# Patient Record
Sex: Female | Born: 1955 | Race: White | Hispanic: No | Marital: Married | Smoking: Former smoker
Health system: Southern US, Community
[De-identification: ages and names within clinical notes are randomized; demographics above are authoritative.]

## PROBLEM LIST (undated history)

## (undated) DIAGNOSIS — R002 Palpitations: Secondary | ICD-10-CM

## (undated) HISTORY — PX: BUNIONECTOMY: SHX129

## (undated) HISTORY — DX: Palpitations: R00.2

## (undated) HISTORY — PX: OTHER SURGICAL HISTORY: SHX169

---

## 2000-09-12 ENCOUNTER — Encounter: Payer: Self-pay | Admitting: Family Medicine

## 2000-09-12 ENCOUNTER — Encounter: Admission: RE | Admit: 2000-09-12 | Discharge: 2000-09-12 | Payer: Self-pay | Admitting: Family Medicine

## 2003-01-01 ENCOUNTER — Encounter: Admission: RE | Admit: 2003-01-01 | Discharge: 2003-01-01 | Payer: Self-pay | Admitting: Family Medicine

## 2004-12-07 ENCOUNTER — Encounter: Admission: RE | Admit: 2004-12-07 | Discharge: 2004-12-07 | Payer: Self-pay | Admitting: Family Medicine

## 2006-05-23 ENCOUNTER — Encounter: Admission: RE | Admit: 2006-05-23 | Discharge: 2006-05-23 | Payer: Self-pay | Admitting: Family Medicine

## 2007-08-20 ENCOUNTER — Encounter: Admission: RE | Admit: 2007-08-20 | Discharge: 2007-08-20 | Payer: Self-pay | Admitting: Family Medicine

## 2010-12-15 ENCOUNTER — Ambulatory Visit (INDEPENDENT_AMBULATORY_CARE_PROVIDER_SITE_OTHER): Payer: BC Managed Care – PPO

## 2010-12-15 DIAGNOSIS — Z23 Encounter for immunization: Secondary | ICD-10-CM

## 2011-08-29 ENCOUNTER — Other Ambulatory Visit: Payer: Self-pay | Admitting: Family Medicine

## 2011-08-29 DIAGNOSIS — Z780 Asymptomatic menopausal state: Secondary | ICD-10-CM

## 2011-09-15 ENCOUNTER — Ambulatory Visit
Admission: RE | Admit: 2011-09-15 | Discharge: 2011-09-15 | Disposition: A | Discharge status: Home / Self Care | Payer: BC Managed Care – PPO | Source: Ambulatory Visit | Attending: Family Medicine | Admitting: Family Medicine

## 2011-09-15 DIAGNOSIS — Z780 Asymptomatic menopausal state: Secondary | ICD-10-CM

## 2011-10-24 ENCOUNTER — Ambulatory Visit (INDEPENDENT_AMBULATORY_CARE_PROVIDER_SITE_OTHER): Payer: BC Managed Care – PPO | Admitting: Family Medicine

## 2011-10-24 VITALS — BP 102/62 | HR 100 | Temp 102.9°F | Resp 18 | Ht 63.5 in | Wt 126.0 lb

## 2011-10-24 DIAGNOSIS — R6889 Other general symptoms and signs: Secondary | ICD-10-CM

## 2011-10-24 DIAGNOSIS — R509 Fever, unspecified: Secondary | ICD-10-CM

## 2011-10-24 DIAGNOSIS — J111 Influenza due to unidentified influenza virus with other respiratory manifestations: Secondary | ICD-10-CM

## 2011-10-24 LAB — POCT INFLUENZA A/B
Influenza A, POC: NEGATIVE
Influenza B, POC: NEGATIVE

## 2011-10-24 LAB — POCT CBC
Granulocyte percent: 80.3 %G — AB (ref 37–80)
HCT, POC: 42.2 % (ref 37.7–47.9)
Hemoglobin: 12.6 g/dL (ref 12.2–16.2)
Lymph, poc: 0.7 (ref 0.6–3.4)
MCH, POC: 29 pg (ref 27–31.2)
MCHC: 29.9 g/dL — AB (ref 31.8–35.4)
MCV: 97.1 fL — AB (ref 80–97)
MID (cbc): 0.3 (ref 0–0.9)
MPV: 6.6 fL (ref 0–99.8)
POC Granulocyte: 4 (ref 2–6.9)
POC LYMPH PERCENT: 14 %L (ref 10–50)
POC MID %: 5.7 %M (ref 0–12)
Platelet Count, POC: 231 10*3/uL (ref 142–424)
RBC: 4.35 M/uL (ref 4.04–5.48)
RDW, POC: 12.4 %
WBC: 5 10*3/uL (ref 4.6–10.2)

## 2011-10-24 MED ORDER — OSELTAMIVIR PHOSPHATE 75 MG PO CAPS: 75.0000 mg | ORAL_CAPSULE | Freq: Two times a day (BID) | ORAL | Status: DC

## 2011-10-24 NOTE — Progress Notes (Signed)
[facility]:  Office Visit  Chief Complaint:  Chief Complaint  Patient presents with  . Fever    ?  flu 103 today  . Generalized Body Aches    headache/ stated with sore throat this is better some cough today    HPI: Amy Pitts is a 56 y.o. female who complains of  Acute onset x 1 day of body aches and headaches and fever T max 103. Trying to let fever defervece at night , has been taking Advil during the day. 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  History reviewed. No pertinent past medical history. Past Surgical History  Procedure Date  . C cection   . Bunionectomy    History   Social History  . Marital Status: Married    Spouse Name: N/A    Number of Children: N/A  . Years of Education: N/A   Social History Main Topics  . Smoking status: Former Smoker -- 2 years  . Smokeless tobacco: Never Used  . Alcohol Use: 1.0 oz/week    2 drink(s) per week  . Drug Use: No  . Sexually Active: None   Other Topics Concern  . None   Social History Narrative  . None   Family History  Problem Relation Age of Onset  . Hypertension Mother   . Heart disease Father    Allergies no known allergies Prior to Admission medications   Not on File     ROS: The patient denies night sweats, unintentional weight loss, chest pain, palpitations, wheezing, dyspnea on exertion, nausea, vomiting, abdominal pain, dysuria, hematuria, melena, numbness, weakness, or tingling.  All other systems have been reviewed and were otherwise negative with the exception of those mentioned in the HPI and as above.    PHYSICAL EXAM: Filed Vitals:   10/24/11 1601  BP: 102/62  Pulse: 100  Temp: 102.9 F (39.4 C)  Resp: 18   Filed Vitals:   10/24/11 1601  Height: 5' 3.5" (1.613 m)  Weight: 126 lb (57.153 kg)   Body mass index is 21.97 kg/(m^2).  General: Alert, no acute distress HEENT:  Normocephalic, atraumatic,  oropharynx patent.  Cardiovascular:  Regular rate and rhythm, no rubs murmurs or gallops.  No Carotid bruits, radial pulse intact. No pedal edema.  Respiratory: Clear to auscultation bilaterally.  No wheezes, rales, or rhonchi.  No cyanosis, no use of accessory musculature GI: No organomegaly, abdomen is soft and non-tender, positive bowel sounds.  No masses. Skin: No rashes. Neurologic: Facial musculature symmetric. Psychiatric: Patient is appropriate throughout our interaction. Lymphatic: No cervical lymphadenopathy Musculoskeletal: Gait intact.   LABS: Results for orders placed in visit on 10/24/11  POCT INFLUENZA A/B      Component Value Range   Influenza A, POC Negative     Influenza B, POC Negative    POCT CBC      Component Value Range   WBC 5.0  4.6 - 10.2 K/uL   Lymph, poc 0.7  0.6 - 3.4   POC LYMPH PERCENT 14.0  10 - 50 %L   MID (cbc) 0.3  0 - 0.9   POC MID % 5.7  0 - 12 %M   POC Granulocyte 4.0  2 - 6.9   Granulocyte percent 80.3 (*) 37 - 80 %G   RBC 4.35  4.04 - 5.48 M/uL   Hemoglobin 12.6  12.2 - 16.2 g/dL  HCT, POC 42.2  37.7 - 47.9 %   MCV 97.1 (*) 80 - 97 fL   MCH, POC 29.0  27 - 31.2 pg   MCHC 29.9 (*) 31.8 - 35.4 g/dL   RDW, POC 40.9     Platelet Count, POC 231  142 - 424 K/uL   MPV 6.6  0 - 99.8 fL     EKG/XRAY:   Primary read interpreted by Dr. Conley Rolls at [facility].   ASSESSMENT/PLAN: Encounter Diagnoses  Name Primary?  . Flu-like symptoms Yes  . 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

## 2011-10-25 ENCOUNTER — Telehealth: Payer: Self-pay | Admitting: Family Medicine

## 2011-10-25 NOTE — Telephone Encounter (Signed)
LM that CBC was normal

## 2013-08-27 ENCOUNTER — Other Ambulatory Visit: Payer: Self-pay | Admitting: Chiropractic Medicine

## 2013-08-27 ENCOUNTER — Ambulatory Visit
Admission: RE | Admit: 2013-08-27 | Discharge: 2013-08-27 | Disposition: A | Discharge status: Home / Self Care | Payer: BC Managed Care – PPO | Source: Ambulatory Visit | Attending: Chiropractic Medicine | Admitting: Chiropractic Medicine

## 2013-08-27 DIAGNOSIS — M542 Cervicalgia: Secondary | ICD-10-CM

## 2014-12-23 ENCOUNTER — Other Ambulatory Visit: Payer: Self-pay

## 2014-12-23 DIAGNOSIS — Z1231 Encounter for screening mammogram for malignant neoplasm of breast: Secondary | ICD-10-CM

## 2015-01-20 ENCOUNTER — Ambulatory Visit
Admission: RE | Admit: 2015-01-20 | Discharge: 2015-01-20 | Disposition: A | Discharge status: Home / Self Care | Payer: BC Managed Care – PPO | Source: Ambulatory Visit

## 2015-01-20 DIAGNOSIS — Z1231 Encounter for screening mammogram for malignant neoplasm of breast: Secondary | ICD-10-CM

## 2015-04-07 ENCOUNTER — Other Ambulatory Visit (HOSPITAL_COMMUNITY)
Admission: RE | Admit: 2015-04-07 | Discharge: 2015-04-07 | Disposition: A | Discharge status: Home / Self Care | Payer: BC Managed Care – PPO | Source: Ambulatory Visit | Attending: Family Medicine | Admitting: Family Medicine

## 2015-04-07 ENCOUNTER — Other Ambulatory Visit: Payer: Self-pay | Admitting: Family Medicine

## 2015-04-07 DIAGNOSIS — Z1151 Encounter for screening for human papillomavirus (HPV): Secondary | ICD-10-CM | POA: Diagnosis present

## 2015-04-07 DIAGNOSIS — Z01419 Encounter for gynecological examination (general) (routine) without abnormal findings: Secondary | ICD-10-CM | POA: Insufficient documentation

## 2015-04-08 LAB — CYTOLOGY - PAP

## 2017-11-25 NOTE — Progress Notes (Signed)
Amy ScaleZach Flossie Pitts D.O. [facility] [address] [place], [place] [address] Phone: [phone] Subjective:   I Amy NighKana Pitts am serving as a Neurosurgeonscribe for Dr. Antoine PrimasZachary Kimiah Pitts.   CC: Left greater than right shoulder pain  WNU:[id number]:Subjective  Amy RushSusan G Pitts Amy Pitts is a 62 y.o. female coming in with complaint of bilateral shoulder pain. Left is worse. Initially felt like a frozen shoulder. Has been manipulated. Never had xrays. Wants a clear diagnosis so she knows what type of exercises she can do. Limited ROM. Pain radiates down the arm. No numbness and tingling or neck pain noted. Has noted her shoulder pop.   Onset- Chronic Location- Anterior Duration-  Character- sharp, puling Aggravating factors- extension, flexion at 90 degrees Reliving factors- Ibuprofen, heat Therapies tried- Manipulation, dry needling, pressure point release with ball Severity-7 out of 10     No past medical history on file. Past Surgical History:  Procedure Laterality Date  . BUNIONECTOMY    . c cection     Social History   Socioeconomic History  . Marital status: Married    Spouse name: Not on file  . Number of children: Not on file  . Years of education: Not on file  . Highest education level: Not on file  Occupational History  . Not on file  Social Needs  . Financial resource strain: Not on file  . Food insecurity:    Worry: Not on file    Inability: Not on file  . Transportation needs:    Medical: Not on file    Non-medical: Not on file  Tobacco Use  . Smoking status: Former Smoker    Years: 2.00  . Smokeless tobacco: Never Used  Substance and Sexual Activity  . Alcohol use: Yes    Alcohol/week: 2.0 standard drinks    Types: 2 drink(s) per week  . Drug use: No  . Sexual activity: Not on file  Lifestyle  . Physical activity:    Days per week: Not on file    Minutes per session: Not on file  . Stress: Not on file  Relationships  . Social connections:    Talks on phone: Not on file      Gets together: Not on file    Attends religious service: Not on file    Active member of club or organization: Not on file    Attends meetings of clubs or organizations: Not on file    Relationship status: Not on file  Other Topics Concern  . Not on file  Social History Narrative  . Not on file   Allergies no known allergies Family History  Problem Relation Age of Onset  . Hypertension Mother   . Heart disease Father      Current Outpatient Medications (Cardiovascular):  .  nitroGLYCERIN (NITRODUR - DOSED IN MG/24 HR) 0.2 mg/hr patch, 1/4 patch daily     Current Outpatient Medications (Other):  .  oseltamivir (TAMIFLU) 75 MG capsule, Take 1 capsule (75 mg total) by mouth 2 (two) times daily. .  Vitamin D, Ergocalciferol, (DRISDOL) 1.25 MG (50000 UT) CAPS capsule, Take 1 capsule (50,000 Units total) by mouth every 7 (seven) days.    Past medical history, social, surgical and family history all reviewed in electronic medical record.  No pertanent information unless stated regarding to the chief complaint.   Review of Systems:  No headache, visual changes, nausea, vomiting, diarrhea, constipation, dizziness, abdominal pain, skin rash, fevers, chills, night sweats, weight loss, swollen lymph  nodes, body aches, joint swelling, muscle aches, chest pain, shortness of breath, mood changes.   Objective  Blood pressure 122/82, pulse (!) 59, height 5' 3.5" (1.613 m), weight 128 lb (58.1 kg), SpO2 98 %.    General: No apparent distress alert and oriented x3 mood and affect normal, dressed appropriately.  HEENT: Pupils equal, extraocular movements intact  Respiratory: Patient's speak in full sentences and does not appear short of breath  Cardiovascular: No lower extremity edema, non tender, no erythema  Skin: Warm dry intact with no signs of infection or rash on extremities or on axial skeleton.  Abdomen: Soft nontender  Neuro: Cranial nerves II through XII are intact,  neurovascularly intact in all extremities with 2+ DTRs and 2+ pulses.  Lymph: No lymphadenopathy of posterior or anterior cervical chain or axillae bilaterally.  Gait normal with good balance and coordination.  MSK:  Non tender with full range of motion and good stability and symmetric strength and tone of  elbows, wrist, hip, knee and ankles bilaterally.  Shoulder: left Inspection reveals no abnormalities, atrophy or asymmetry. Palpation is normal with no tenderness over AC joint or bicipital groove. Motion decreased external rotation Rotator cuff strength normal throughout. signs of impingement with positive Neer and Hawkin's tests, but negative empty can sign. Speeds and Yergason's tests normal. No labral pathology noted with negative Obrien's, negative clunk and good stability. Normal scapular function observed. No painful arc and no drop arm sign. No apprehension sign  Contralateral shoulder has some mild impingement but full strength  MSK US performed of: left This study was ordered, performed, and interpreted by Terrilee Files D.O.  Shoulder:   Supraspinatus: Degenerative changes noted.  Rotator cuff tear appreciated but no true retraction mild calcific changes Subscapularis:  Appears normal on long and transverse views. Positive bursa Teres Minor:  Appears normal on long and transverse views. AC joint: Arthritic changes Glenohumeral Joint: Mild arthritis Glenoid Labrum:  Intact without visualized tears. Biceps Tendon:  Appears normal on long and transverse views, no fraying of tendon, tendon located in intertubercular groove, no subluxation with shoulder internal or external rotation.  Impression: Subacromial bursitis with degenerative rotator cuff tear but no retraction      Impression and Recommendations:     This case required medical decision making of moderate complexity. The above documentation has been reviewed and is accurate and complete [name], DO         Note: This dictation was prepared with Dragon dictation along with smaller phrase technology. Any transcriptional errors that result from this process are unintentional.

## 2017-11-26 ENCOUNTER — Ambulatory Visit: Payer: BC Managed Care – PPO | Admitting: Family Medicine

## 2017-11-26 ENCOUNTER — Ambulatory Visit (INDEPENDENT_AMBULATORY_CARE_PROVIDER_SITE_OTHER)
Admission: RE | Admit: 2017-11-26 | Discharge: 2017-11-26 | Disposition: A | Discharge status: Home / Self Care | Payer: BC Managed Care – PPO | Source: Ambulatory Visit | Attending: Family Medicine | Admitting: Family Medicine

## 2017-11-26 ENCOUNTER — Ambulatory Visit: Payer: Self-pay

## 2017-11-26 ENCOUNTER — Encounter: Payer: Self-pay | Admitting: Family Medicine

## 2017-11-26 VITALS — BP 122/82 | HR 59 | Ht 63.5 in | Wt 128.0 lb

## 2017-11-26 DIAGNOSIS — G8929 Other chronic pain: Secondary | ICD-10-CM | POA: Diagnosis not present

## 2017-11-26 DIAGNOSIS — M25512 Pain in left shoulder: Secondary | ICD-10-CM | POA: Diagnosis not present

## 2017-11-26 DIAGNOSIS — M75112 Incomplete rotator cuff tear or rupture of left shoulder, not specified as traumatic: Secondary | ICD-10-CM

## 2017-11-26 DIAGNOSIS — M25511 Pain in right shoulder: Secondary | ICD-10-CM

## 2017-11-26 DIAGNOSIS — M75102 Unspecified rotator cuff tear or rupture of left shoulder, not specified as traumatic: Secondary | ICD-10-CM | POA: Insufficient documentation

## 2017-11-26 MED ORDER — NITROGLYCERIN 0.2 MG/HR TD PT24: MEDICATED_PATCH | TRANSDERMAL | 1 refills | Status: DC

## 2017-11-26 MED ORDER — VITAMIN D (ERGOCALCIFEROL) 1.25 MG (50000 UNIT) PO CAPS: 50000.0000 [IU] | ORAL_CAPSULE | ORAL | 0 refills | Status: DC

## 2017-11-26 NOTE — Patient Instructions (Signed)
Good to see you.  Ice 20 minutes 2 times daily. Usually after activity and before bed. pennsaid pinkie amount topically 2 times daily as needed.  Keep hands within peripheral vision  Once weekly vitamin D for 12 weeks pennsaid pinkie amount topically 2 times daily as needed.  Over the counter get turmeric 500mg  daily  Xray downstairs on way out Nitroglycerin Protocol   Apply 1/4 nitroglycerin patch to affected area daily.  Change position of patch within the affected area every 24 hours.  You may experience a headache during the first 1-2 weeks of using the patch, these should subside.  If you experience headaches after beginning nitroglycerin patch treatment, you may take your preferred over the counter pain reliever.  Another side effect of the nitroglycerin patch is skin irritation or rash related to patch adhesive.  Please notify our office if you develop more severe headaches or rash, and stop the patch.  Tendon healing with nitroglycerin patch may require 12 to 24 weeks depending on the extent of injury.  Men should not use if taking Viagra, Cialis, or Levitra.   Do not use if you have migraines or rosacea.  See me again in 4-6 weeks

## 2017-11-26 NOTE — Assessment & Plan Note (Signed)
Degenerative tear noted.  Discussed icing regimen and home exercises.  Discussed which activities to do which wants to avoid.  Patient will increase activity slowly over the course the next several days.  Discussed which activities to do which wants to avoid.  Follow-up again in 4 to 6 weeks

## 2018-01-14 ENCOUNTER — Ambulatory Visit: Payer: BC Managed Care – PPO | Admitting: Family Medicine

## 2018-02-08 ENCOUNTER — Other Ambulatory Visit: Payer: Self-pay | Admitting: Family Medicine

## 2018-02-08 DIAGNOSIS — Z1231 Encounter for screening mammogram for malignant neoplasm of breast: Secondary | ICD-10-CM

## 2018-02-11 ENCOUNTER — Ambulatory Visit
Admission: RE | Admit: 2018-02-11 | Discharge: 2018-02-11 | Disposition: A | Discharge status: Home / Self Care | Payer: BC Managed Care – PPO | Source: Ambulatory Visit | Attending: Family Medicine | Admitting: Family Medicine

## 2018-02-11 DIAGNOSIS — Z1231 Encounter for screening mammogram for malignant neoplasm of breast: Secondary | ICD-10-CM

## 2018-02-12 ENCOUNTER — Other Ambulatory Visit: Payer: Self-pay | Admitting: Family Medicine

## 2018-09-04 ENCOUNTER — Other Ambulatory Visit: Payer: Self-pay | Admitting: Family Medicine

## 2018-09-04 NOTE — Telephone Encounter (Signed)
Left message for patient to call back for virtual visit.  

## 2018-12-04 ENCOUNTER — Other Ambulatory Visit: Payer: Self-pay

## 2018-12-04 DIAGNOSIS — Z20822 Contact with and (suspected) exposure to covid-19: Secondary | ICD-10-CM

## 2018-12-05 LAB — NOVEL CORONAVIRUS, NAA: SARS-CoV-2, NAA: NOT DETECTED

## 2019-03-27 ENCOUNTER — Other Ambulatory Visit: Payer: Self-pay

## 2019-03-27 ENCOUNTER — Encounter: Payer: Self-pay | Admitting: Cardiology

## 2019-03-27 ENCOUNTER — Ambulatory Visit: Payer: BC Managed Care – PPO | Admitting: Cardiology

## 2019-03-27 ENCOUNTER — Encounter: Payer: Self-pay | Admitting: *Deleted

## 2019-03-27 VITALS — BP 133/79 | HR 60 | Ht 62.0 in | Wt 129.6 lb

## 2019-03-27 DIAGNOSIS — Z8249 Family history of ischemic heart disease and other diseases of the circulatory system: Secondary | ICD-10-CM

## 2019-03-27 DIAGNOSIS — Z7189 Other specified counseling: Secondary | ICD-10-CM | POA: Diagnosis not present

## 2019-03-27 DIAGNOSIS — R002 Palpitations: Secondary | ICD-10-CM

## 2019-03-27 NOTE — Progress Notes (Signed)
Cardiology Office Note:    Date:  03/27/2019   ID:  Amy Pitts, DOB 02-Feb-1955, MRN [id number]  PCP:  Patient, No Pcp Per  Cardiologist:  Jodelle Red, MD  Referring MD: Jarrett Soho, PA-C   CC: new patient consultation for palpitations  History of Present Illness:    Amy Pitts is a 64 y.o. female with a hx of seasonal allergies, depression who is seen as a new consult at the request of Jarrett Soho, PA-C for the evaluation and management of palpitations.  Note from visit dated 03/13/19 with Jarrett Soho, PA reviewed from [place] at [facility]. Noted sensations in her chest intermittently for the last several years. History of remote echo due to possible murmur, told it was normal.  Brings a copy of her echo report from 08/28/1989. Noted to have normal echo, normal valves without disease, normal IAS and IVS, normal cardiac dimensions, normal ventricular function, normal color flow.  Once "in a blue moon" she would noticed her heart for about a second, then wouldn't notice for about a year. May have increased slightly over time. 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 Initially responded very well, but then began noting frequent regular hard heartbeats and an occasional chest flutter that makes her cough. Happening several times/day since 01/2019. Also feels very hard beats on occasion.  -Aggravating/alleviating factors: none other than bupropion. Possibly also less exercise than before -Syncope/near syncope: none -Prior cardiac history: none -Prior workup: echo as noted -Prior treatment: none -Possible medication interactions: bupropion -Caffeine: 2 strong cups of coffee in the AM -Alcohol: 1 glass of wine per night twice per week -Tobacco: former, remote (senior year high school/freshman year of college) -OTC supplements: only as listed -Comorbidities: none -Exercise level: usually exercise 5-6  days/week, either walking 4 miles or riding stationary bike. -Labs: not yet completed, pending lipids, CMP, TSH, CBC -Cardiac ROS: no shortness of breath, no PND, no orthopnea, no LE edema. -Family history: father died of massive MI at age 69, many risk factors. Mother had a stroke, had unregulated high blood pressure.  History reviewed. No pertinent past medical history.  Past Surgical History:  Procedure Laterality Date  . BUNIONECTOMY    . c cection      Current Medications: Current Outpatient Medications on File Prior to Visit  Medication Sig  . loratadine (CLARITIN) 10 MG tablet Take 10 mg by mouth daily.  Marland Kitchen buPROPion (WELLBUTRIN SR) 100 MG 12 hr tablet Take 100 mg by mouth every morning.  . Vitamin D, Ergocalciferol, (DRISDOL) 1.25 MG (50000 UT) CAPS capsule TAKE 1 CAPSULE (50,000 UNITS TOTAL) BY MOUTH EVERY 7 (SEVEN) DAYS.   No current facility-administered medications on file prior to visit.     Allergies:   Patient has no known allergies.   Social History   Tobacco Use  . Smoking status: Former Smoker    Years: 2.00  . Smokeless tobacco: Never Used  Substance Use Topics  . Alcohol use: Yes    Alcohol/week: 2.0 standard drinks    Types: 2 drink(s) per week  . Drug use: No    Family History: family history includes Heart disease in her father; Hypertension in her mother. There is no history of Breast cancer.  ROS:   Please see the history of present illness.  Additional pertinent ROS: Constitutional: Negative for chills, fever, night sweats, unintentional weight loss  HENT: Negative for ear pain and hearing loss.  Eyes: Negative for loss of vision and eye pain.  Respiratory: Negative for cough, sputum, wheezing.   Cardiovascular: See HPI. Gastrointestinal: Negative for abdominal pain, melena, and hematochezia.  Genitourinary: Negative for dysuria and hematuria.  Musculoskeletal: Negative for falls and myalgias.  Skin: Negative for itching and rash.    Neurological: Negative for focal weakness, focal sensory changes and loss of consciousness.  Endo/Heme/Allergies: Does not bruise/bleed easily.     EKGs/Labs/Other Studies Reviewed:    The following studies were reviewed today: No prior cardiac studies  EKG:  EKG is personally reviewed.  The ekg ordered today demonstrates sinus bradycardia with artifact  Recent Labs: No results found for requested labs within last 8760 hours.  Recent Lipid Panel No results found for: CHOL, TRIG, HDL, CHOLHDL, VLDL, LDLCALC, LDLDIRECT  Physical Exam:    VS:  BP 133/79   Pulse 60   Ht 5\' 2"  (1.575 m)   Wt 129 lb 9.6 oz (58.8 kg)   SpO2 100%   BMI 23.70 kg/m     Wt Readings from Last 3 Encounters:  11/26/17 128 lb (58.1 kg)  10/24/11 126 lb (57.2 kg)    GEN: Well nourished, well developed in no acute distress HEENT: Normal, moist mucous membranes NECK: No JVD CARDIAC: regular rhythm, normal S1 and S2, no rubs or gallops. No murmurs. VASCULAR: Radial and DP pulses 2+ bilaterally. No carotid bruits RESPIRATORY:  Clear to auscultation without rales, wheezing or rhonchi  ABDOMEN: Soft, non-tender, non-distended MUSCULOSKELETAL:  Ambulates independently SKIN: Warm and dry, no edema NEUROLOGIC:  Alert and oriented x 3. No focal neuro deficits noted. PSYCHIATRIC:  Normal affect    ASSESSMENT:    1. Palpitation   2. Cardiac risk counseling   3. Counseling on health promotion and disease prevention   4. Family history of heart disease    PLAN:    Palpitations: -ECG today sinus bradycardia -will get Zio monitor to exclude significant arrhythmias  Family history of heart disease, in both mother and father: discussed recommendations for prevention, below -recommend checking lipids for further risk stratification. She has upcoming labs with PCP  Cardiac risk counseling and prevention recommendations: -recommend heart healthy/Mediterranean diet, with whole grains, fruits, vegetable, fish,  lean meats, nuts, and olive oil. Limit salt. -recommend moderate walking, 3-5 times/week for 30-50 minutes each session. Aim for at least 150 minutes.week. Goal should be pace of 3 miles/hours, or walking 1.5 miles in 30 minutes -recommend avoidance of tobacco products. Avoid excess alcohol. -ASCVD risk score: The ASCVD Risk score 10/26/11 DC Jr., et al., 2013) failed to calculate for the following reasons:   Cannot find a previous HDL lab   Cannot find a previous total cholesterol lab    Plan for follow up: if monitor unremarkable, can follow up as needed  2014, MD, PhD [place]  [facility]    Medication Adjustments/Labs and Tests Ordered: Current medicines are reviewed at length with the patient today.  Concerns regarding medicines are outlined above.  Orders Placed This Encounter  Procedures  . LONG TERM MONITOR (3-14 DAYS)  . EKG 12-Lead   No orders of the defined types were placed in this encounter.   Patient Instructions  Medication Instructions:  Your Physician recommend you continue on your current medication as directed.    *If you need a refill on your cardiac medications before your next appointment, please call your pharmacy*   Lab Work: None  Testing/Procedures: Our physician has recommended that you wear an 7  DAY ZIO-PATCH monitor. The Zio patch cardiac monitor continuously records heart rhythm data for up to 14 days, this is for patients being evaluated for multiple types heart rhythms. For the first 24 hours post application, please avoid getting the Zio monitor wet in the shower or by excessive sweating during exercise. After that, feel free to carry on with regular activities. Keep soaps and lotions away from the ZIO XT Patch.   Someone will call to mail monitor.       Follow-Up: At [facility], you and your health needs are our priority.  As part of our continuing mission to provide you with exceptional heart care, we have created  designated Provider Care Teams.  These Care Teams include your primary Cardiologist (physician) and Advanced Practice Providers (APPs -  Physician Assistants and Nurse Practitioners) who all work together to provide you with the care you need, when you need it.  We recommend signing up for the patient portal called "MyChart".  Sign up information is provided on this After Visit Summary.  MyChart is used to connect with patients for Virtual Visits (Telemedicine).  Patients are able to view lab/test results, encounter notes, upcoming appointments, etc.  Non-urgent messages can be sent to your provider as well.   To learn more about what you can do with MyChart, go to [email or web address].    Your next appointment:   As needed  The format for your next appointment:   Either In Person or Virtual  Provider:   Buford Dresser, MD  [place] Instructions   Your physician has requested you wear your ZIO patch monitor___7____days.   This is a single patch monitor.  [employer] supplies one patch monitor per enrollment.  Additional stickers are not available.   Please do not apply patch if you will be having a Nuclear Stress Test, Echocardiogram, Cardiac CT, MRI, or Chest Xray during the time frame you would be wearing the monitor. The patch cannot be worn during these tests.  You cannot remove and re-apply the ZIO XT patch monitor.   Your ZIO patch monitor will be sent USPS Priority mail from [facility] directly to your home address. The monitor may also be mailed to a PO BOX if home delivery is not available.   It may take 3-5 days to receive your monitor after you have been enrolled.   Once you have received you monitor, please review enclosed instructions.  Your monitor has already been registered assigning a specific monitor serial # to you.   Applying the monitor   Shave hair from upper left chest.   Hold abrader disc by orange tab.  Rub abrader in 40 strokes  over left upper chest as indicated in your monitor instructions.   Clean area with 4 enclosed alcohol pads .  Use all pads to assure are is cleaned thoroughly.  Let dry.   Apply patch as indicated in monitor instructions.  Patch will be place under collarbone on left side of chest with arrow pointing upward.   Rub patch adhesive wings for 2 minutes.Remove white label marked "1".  Remove white label marked "2".  Rub patch adhesive wings for 2 additional minutes.   While looking in a mirror, press and release button in center of patch.  A small green light will flash 3-4 times .  This will be your only indicator the monitor has been turned on.     Do not shower for the first 24 hours.  You may  shower after the first 24 hours.   Press button if you feel a symptom. You will hear a small click.  Record Date, Time and Symptom in the Patient Log Book.   When you are ready to remove patch, follow instructions on last 2 pages of Patient Log Book.  Stick patch monitor onto last page of Patient Log Book.   Place Patient Log Book in Baxter box.  Use locking tab on box and tape box closed securely.  The [employer] has [employer] on it.  Please place in mailbox as soon as possible.  Your physician should have your test results approximately 7 days after the monitor has been mailed back to [employer] [facility].   Call [facility] Customer Care at [phone] if you have questions regarding your ZIO XT patch monitor.  Call them immediately if you see an orange light blinking on your monitor.   If your monitor falls off in less than 4 days contact our Monitor department at [phone].  If your monitor becomes loose or falls off after 4 days call [employer] at [phone] for suggestions on securing your monitor.      Signed, Jodelle Red, MD PhD 03/27/2019  [facility]

## 2019-03-27 NOTE — Progress Notes (Signed)
Patient ID: Amy Pitts, female   DOB: [birth year], 64 y.o.   MRN: [id number] Patient enrolled for [employer] to mail a 7 day ZIO XT long term holter monitor to her home.

## 2019-03-27 NOTE — Patient Instructions (Signed)
Medication Instructions:  Your Physician recommend you continue on your current medication as directed.    *If you need a refill on your cardiac medications before your next appointment, please call your pharmacy*   Lab Work: None  Testing/Procedures: Our physician has recommended that you wear an  7 DAY ZIO-PATCH monitor. The Zio patch cardiac monitor continuously records heart rhythm data for up to 14 days, this is for patients being evaluated for multiple types heart rhythms. For the first 24 hours post application, please avoid getting the Zio monitor wet in the shower or by excessive sweating during exercise. After that, feel free to carry on with regular activities. Keep soaps and lotions away from the ZIO XT Patch.   Someone will call to mail monitor.     Follow-Up: At [facility], you and your health needs are our priority.  As part of our continuing mission to provide you with exceptional heart care, we have created designated Provider Care Teams.  These Care Teams include your primary Cardiologist (physician) and Advanced Practice Providers (APPs -  Physician Assistants and Nurse Practitioners) who all work together to provide you with the care you need, when you need it.  We recommend signing up for the patient portal called "MyChart".  Sign up information is provided on this After Visit Summary.  MyChart is used to connect with patients for Virtual Visits (Telemedicine).  Patients are able to view lab/test results, encounter notes, upcoming appointments, etc.  Non-urgent messages can be sent to your provider as well.   To learn more about what you can do with MyChart, go to [email or web address].    Your next appointment:   As needed  The format for your next appointment:   Either In Person or Virtual  Provider:   Bridgette Christopher, MD  ZIO XT- Long Term Monitor Instructions   Your physician has requested you wear your ZIO patch monitor__7_____days.   This is a  single patch monitor.  [employer] supplies one patch monitor per enrollment.  Additional stickers are not available.   Please do not apply patch if you will be having a Nuclear Stress Test, Echocardiogram, Cardiac CT, MRI, or Chest Xray during the time frame you would be wearing the monitor. The patch cannot be worn during these tests.  You cannot remove and re-apply the ZIO XT patch monitor.   Your ZIO patch monitor will be sent USPS Priority mail from [employer] directly to your home address. The monitor may also be mailed to a PO BOX if home delivery is not available.   It may take 3-5 days to receive your monitor after you have been enrolled.   Once you have received you monitor, please review enclosed instructions.  Your monitor has already been registered assigning a specific monitor serial # to you.   Applying the monitor   Shave hair from upper left chest.   Hold abrader disc by orange tab.  Rub abrader in 40 strokes over left upper chest as indicated in your monitor instructions.   Clean area with 4 enclosed alcohol pads .  Use all pads to assure are is cleaned thoroughly.  Let dry.   Apply patch as indicated in monitor instructions.  Patch will be place under collarbone on left side of chest with arrow pointing upward.   Rub patch adhesive wings for 2 minutes.Remove white label marked "1".  Remove white label marked "2".  Rub patch adhesive wings for 2 additional minutes.   While looking in   looking in a mirror, press and release button in center of patch.  A small green light will flash 3-4 times .  This will be your only indicator the monitor has been turned on.     Do not shower for the first 24 hours.  You may shower after the first 24 hours.   Press button if you feel a symptom. You will hear a small click.  Record Date, Time and Symptom in the Patient Log Book.   When you are ready to remove patch, follow instructions on last 2 pages of Patient Log Book.  Stick patch monitor  onto last page of Patient Log Book.   Place Patient Log Book in [place] box.  Use locking tab on box and tape box closed securely.  The [employer] and [employer] has [employer] on it.  Please place in mailbox as soon as possible.  Your physician should have your test results approximately 7 days after the monitor has been mailed back to [facility].   Call [place] at [phone] if you have questions regarding your ZIO XT patch monitor.  Call them immediately if you see an orange light blinking on your monitor.   If your monitor falls off in less than 4 days contact our Monitor department at [phone].  If your monitor becomes loose or falls off after 4 days call [employer] at [phone] for suggestions on securing your monitor.

## 2019-05-05 ENCOUNTER — Ambulatory Visit (INDEPENDENT_AMBULATORY_CARE_PROVIDER_SITE_OTHER): Payer: BC Managed Care – PPO

## 2019-05-05 DIAGNOSIS — R002 Palpitations: Secondary | ICD-10-CM

## 2019-05-19 ENCOUNTER — Encounter: Payer: Self-pay | Admitting: Cardiology

## 2019-05-23 ENCOUNTER — Encounter: Payer: Self-pay | Admitting: Gastroenterology

## 2019-05-29 NOTE — Telephone Encounter (Signed)
Can you help set up a follow up for her? Thanks.

## 2019-06-03 ENCOUNTER — Telehealth: Payer: Self-pay | Admitting: Cardiology

## 2019-06-03 NOTE — Telephone Encounter (Signed)
Patient was already scheduled for 06/12/19 with Dr. Cristal Deer.

## 2019-06-03 NOTE — Telephone Encounter (Signed)
-----   Message from Parke Poisson, RN sent at 06/03/2019  1:49 PM EDT ----- Please schedule an appointment with Dr. Cristal Deer per [place] message.

## 2019-06-03 NOTE — Telephone Encounter (Signed)
Message sent to scheduling to schedule appointment.  

## 2019-06-12 ENCOUNTER — Other Ambulatory Visit: Payer: Self-pay

## 2019-06-12 ENCOUNTER — Ambulatory Visit: Payer: BC Managed Care – PPO | Admitting: Cardiology

## 2019-06-12 ENCOUNTER — Encounter: Payer: Self-pay | Admitting: Cardiology

## 2019-06-12 VITALS — BP 132/76 | HR 65 | Ht 62.0 in | Wt 128.0 lb

## 2019-06-12 DIAGNOSIS — Z712 Person consulting for explanation of examination or test findings: Secondary | ICD-10-CM

## 2019-06-12 DIAGNOSIS — Z7189 Other specified counseling: Secondary | ICD-10-CM

## 2019-06-12 DIAGNOSIS — I471 Supraventricular tachycardia: Secondary | ICD-10-CM

## 2019-06-12 DIAGNOSIS — R002 Palpitations: Secondary | ICD-10-CM

## 2019-06-12 DIAGNOSIS — Z8249 Family history of ischemic heart disease and other diseases of the circulatory system: Secondary | ICD-10-CM | POA: Diagnosis not present

## 2019-06-12 NOTE — Patient Instructions (Signed)
Medication Instructions:  Your Physician recommend you continue on your current medication as directed.    *If you need a refill on your cardiac medications before your next appointment, please call your pharmacy*   Lab Work: None   Testing/Procedures: Your physician has requested that you have an echocardiogram. Echocardiography is a painless test that uses sound waves to create images of your heart. It provides your doctor with information about the size and shape of your heart and how well your heart's chambers and valves are working. This procedure takes approximately one hour. There are no restrictions for this procedure. [address]    Follow-Up: At [facility], you and your health needs are our priority.  As part of our continuing mission to provide you with exceptional heart care, we have created designated Provider Care Teams.  These Care Teams include your primary Cardiologist (physician) and Advanced Practice Providers (APPs -  Physician Assistants and Nurse Practitioners) who all work together to provide you with the care you need, when you need it.  We recommend signing up for the patient portal called "MyChart".  Sign up information is provided on this After Visit Summary.  MyChart is used to connect with patients for Virtual Visits (Telemedicine).  Patients are able to view lab/test results, encounter notes, upcoming appointments, etc.  Non-urgent messages can be sent to your provider as well.   To learn more about what you can do with MyChart, go to [email or web address].    Your next appointment:   1 year(s)  The format for your next appointment:   In Person  Provider:   Bridgette Christopher, MD    

## 2019-06-12 NOTE — Progress Notes (Signed)
Cardiology Office Note:    Date:  06/12/2019   ID:  Amy Pitts, DOB 30-Jan-1955, MRN [id number]  PCP:  Jarrett Soho, PA-C  Cardiologist:  Jodelle Red, MD  Referring MD: Jarrett Soho, PA-C   CC: follow up  History of Present Illness:    Amy Pitts is a 64 y.o. female with a hx of seasonal allergies, depression who is seen in follow up today. She was initially seen 03/27/19 a new consult at the request of Jarrett Soho, PA-C for the evaluation and management of palpitations.  Today: Took herself off of wellbutrin (weaned off) as she felt her symptoms were worsening. Symptoms have not improved. We reviewed monitor results personally today. Discussed questionable brief VT (looks more idioventricular) and brief paroxysmal SVT.  Exercises frequently without limitation, but feels her heart beating hard when she lays down in bed. Discussed anatomy of the heart.   We discussed echo to rule out structural issues, which she is amenable to. We discussed options for medications, but she does not wish to pursue these if her echo is normal.  She is anxious and alarmed at the frequency of which she can feel her beats. Noted that her 29 triggered events were largely sinus with a single ectopic beat.  Denies chest pain, shortness of breath at rest or with normal exertion. No PND, orthopnea, LE edema or unexpected weight gain. No syncope or palpitations.  Past Medical History:  Diagnosis Date   Palpitations with regular cardiac rhythm     Past Surgical History:  Procedure Laterality Date   BUNIONECTOMY     c cection      Current Medications: Current Outpatient Medications on File Prior to Visit  Medication Sig   loratadine (CLARITIN) 10 MG tablet Take 10 mg by mouth daily.   Vitamin D, Ergocalciferol, (DRISDOL) 1.25 MG (50000 UT) CAPS capsule TAKE 1 CAPSULE (50,000 UNITS TOTAL) BY MOUTH EVERY 7 (SEVEN) DAYS. (Patient taking differently: Take 2,000  Units by mouth daily. )   No current facility-administered medications on file prior to visit.     Allergies:   Patient has no known allergies.   Social History   Tobacco Use   Smoking status: Former Smoker    Years: 2.00   Smokeless tobacco: Never Used  Substance Use Topics   Alcohol use: Yes    Alcohol/week: 2.0 standard drinks    Types: 2 drink(s) per week   Drug use: No    Family History: family history includes Heart disease in her father; Hypertension in her mother. There is no history of Breast cancer.  ROS:   Please see the history of present illness.  Additional pertinent ROS otherwise unremarkable. EKGs/Labs/Other Studies Reviewed:    The following studies were reviewed today: Monitor 05/28/19 7 days of data recorded on Zio monitor. Patient had a min HR of 45 bpm, max HR of 171 bpm, and avg HR of 66 bpm. Predominant underlying rhythm was Sinus Rhythm. No atrial fibrillation, high degree block, or pauses noted. Isolated atrial and ventricular ectopy was rare (<1%). There were 69 triggered events.  Most of these were sinus rhythm with an ectopic beat.   1 run of (questionable) Ventricular Tachycardia occurred lasting 6 beats with a max rate of 152 bpm (avg 137 bpm). This occurred on 4/36/21 at 4:25 PM. 4 Supraventricular Tachycardia runs occurred, the run with the fastest interval lasting 15 beats with a max rate of 171 bpm (avg 148 bpm); the run with the  fastest interval was also the longest.  EKG:  EKG is personally reviewed.  The ekg ordered 03/27/19 demonstrates sinus bradycardia with artifact  Recent Labs: No results found for requested labs within last 8760 hours.  Recent Lipid Panel No results found for: CHOL, TRIG, HDL, CHOLHDL, VLDL, LDLCALC, LDLDIRECT  Physical Exam:    VS:  BP 132/76    Pulse 65    Ht 5\' 2"  (1.575 m)    Wt 128 lb (58.1 kg)    SpO2 98%    BMI 23.41 kg/m     Wt Readings from Last 3 Encounters:  06/12/19 128 lb (58.1 kg)  03/27/19 129  lb 9.6 oz (58.8 kg)  11/26/17 128 lb (58.1 kg)    GEN: Well nourished, well developed in no acute distress HEENT: Normal, moist mucous membranes NECK: No JVD CARDIAC: regular rhythm, normal S1 and S2, no rubs or gallops. No murmur. Single PVC heard with prolonged auscultation VASCULAR: Radial and DP pulses 2+ bilaterally. No carotid bruits RESPIRATORY:  Clear to auscultation without rales, wheezing or rhonchi  ABDOMEN: Soft, non-tender, non-distended MUSCULOSKELETAL:  Ambulates independently SKIN: Warm and dry, no edema NEUROLOGIC:  Alert and oriented x 3. No focal neuro deficits noted. PSYCHIATRIC:  Normal affect   ASSESSMENT:    1. Paroxysmal SVT (supraventricular tachycardia) (HCC)   2. Palpitations   3. Encounter to discuss test results   4. Family history of heart disease   5. Cardiac risk counseling   6. Counseling on health promotion and disease prevention    PLAN:    Palpitations: -ECG sinus bradycardia -Zio without significant arrhythmia. Very brief paroxysmal SVT, commonly seen. Palpitation sensation largely lines up with sinus rhythm -will get echo to exclude structural heart disease  Family history of heart disease, in both mother and father: -per KPN, lipids 04/03/19 show Tchol 201, HDL 72, LDL 112, TG 93  Cardiac risk counseling and prevention recommendations: -recommend heart healthy/Mediterranean diet, with whole grains, fruits, vegetable, fish, lean meats, nuts, and olive oil. Limit salt. -recommend moderate walking, 3-5 times/week for 30-50 minutes each session. Aim for at least 150 minutes.week. Goal should be pace of 3 miles/hours, or walking 1.5 miles in 30 minutes -recommend avoidance of tobacco products. Avoid excess alcohol.  Plan for follow up: 1 year or sooner as needed  Buford Dresser, MD, PhD [place]   [facility]    Medication Adjustments/Labs and Tests Ordered: Current medicines are reviewed at length with the patient today.   Concerns regarding medicines are outlined above.  Orders Placed This Encounter  Procedures   ECHOCARDIOGRAM COMPLETE   No orders of the defined types were placed in this encounter.   Patient Instructions  Medication Instructions:  Your Physician recommend you continue on your current medication as directed.    *If you need a refill on your cardiac medications before your next appointment, please call your pharmacy*   Lab Work: None   Testing/Procedures: Your physician has requested that you have an echocardiogram. Echocardiography is a painless test that uses sound waves to create images of your heart. It provides your doctor with information about the size and shape of your heart and how well your hearts chambers and valves are working. This procedure takes approximately one hour. There are no restrictions for this procedure. [place] [address]    Follow-Up: At [facility], you and your health needs are our priority.  As part of our continuing mission to provide you with exceptional heart  care, we have created designated Provider Care Teams.  These Care Teams include your primary Cardiologist (physician) and Advanced Practice Providers (APPs -  Physician Assistants and Nurse Practitioners) who all work together to provide you with the care you need, when you need it.  We recommend signing up for the patient portal called "MyChart".  Sign up information is provided on this After Visit Summary.  MyChart is used to connect with patients for Virtual Visits (Telemedicine).  Patients are able to view lab/test results, encounter notes, upcoming appointments, etc.  Non-urgent messages can be sent to your provider as well.   To learn more about what you can do with MyChart, go to [email or web address].    Your next appointment:   1 year(s)  The format for your next appointment:   In Person  Provider:   Jodelle Red, MD      Signed, Jodelle Red, MD PhD 06/12/2019  [facility]

## 2019-06-18 ENCOUNTER — Other Ambulatory Visit: Payer: Self-pay

## 2019-06-18 ENCOUNTER — Ambulatory Visit (AMBULATORY_SURGERY_CENTER): Payer: Self-pay | Admitting: *Deleted

## 2019-06-18 VITALS — Ht 62.0 in | Wt 129.0 lb

## 2019-06-18 DIAGNOSIS — Z1211 Encounter for screening for malignant neoplasm of colon: Secondary | ICD-10-CM

## 2019-06-18 MED ORDER — SUTAB 1479-225-188 MG PO TABS: 1.0000 | ORAL_TABLET | Freq: Once | ORAL | 0 refills | Status: AC

## 2019-06-18 NOTE — Progress Notes (Signed)

## 2019-07-01 ENCOUNTER — Other Ambulatory Visit: Payer: Self-pay

## 2019-07-01 ENCOUNTER — Ambulatory Visit (HOSPITAL_COMMUNITY): Payer: BC Managed Care – PPO | Attending: Cardiology

## 2019-07-01 DIAGNOSIS — I471 Supraventricular tachycardia: Secondary | ICD-10-CM

## 2019-07-01 DIAGNOSIS — R002 Palpitations: Secondary | ICD-10-CM

## 2019-07-04 ENCOUNTER — Encounter: Payer: BC Managed Care – PPO | Admitting: Gastroenterology

## 2019-08-05 ENCOUNTER — Encounter: Payer: Self-pay | Admitting: Cardiology

## 2019-10-23 ENCOUNTER — Ambulatory Visit: Payer: BC Managed Care – PPO | Attending: Internal Medicine

## 2019-10-23 DIAGNOSIS — Z23 Encounter for immunization: Secondary | ICD-10-CM

## 2019-10-23 NOTE — Progress Notes (Signed)
   Covid-19 Vaccination Clinic  Name:  Amy Pitts    MRN: [id number] DOB: 1955/08/09  10/23/2019  Ms. Amy Pitts was observed post Covid-19 immunization for 15 minutes without incident. She was provided with Vaccine Information Sheet and instruction to access the V-Safe system.   Ms. Amy Pitts was instructed to call 911 with any severe reactions post vaccine: Marland Kitchen Difficulty breathing  . Swelling of face and throat  . A fast heartbeat  . A bad rash all over body  . Dizziness and weakness

## 2020-03-30 ENCOUNTER — Other Ambulatory Visit: Payer: Self-pay | Admitting: Family Medicine

## 2020-03-30 DIAGNOSIS — Z1231 Encounter for screening mammogram for malignant neoplasm of breast: Secondary | ICD-10-CM

## 2020-03-30 DIAGNOSIS — E2839 Other primary ovarian failure: Secondary | ICD-10-CM

## 2020-03-30 DIAGNOSIS — M8580 Other specified disorders of bone density and structure, unspecified site: Secondary | ICD-10-CM

## 2020-12-09 ENCOUNTER — Ambulatory Visit
Admission: RE | Admit: 2020-12-09 | Discharge: 2020-12-09 | Disposition: A | Discharge status: Home / Self Care | Payer: Medicare PPO | Source: Ambulatory Visit | Attending: Family Medicine | Admitting: Family Medicine

## 2020-12-09 ENCOUNTER — Other Ambulatory Visit: Payer: Self-pay

## 2020-12-09 DIAGNOSIS — Z1231 Encounter for screening mammogram for malignant neoplasm of breast: Secondary | ICD-10-CM | POA: Diagnosis not present

## 2021-01-27 ENCOUNTER — Other Ambulatory Visit: Payer: Self-pay

## 2021-01-27 ENCOUNTER — Ambulatory Visit (HOSPITAL_BASED_OUTPATIENT_CLINIC_OR_DEPARTMENT_OTHER): Payer: Medicare PPO | Admitting: Cardiology

## 2021-01-27 ENCOUNTER — Encounter (HOSPITAL_BASED_OUTPATIENT_CLINIC_OR_DEPARTMENT_OTHER): Payer: Self-pay | Admitting: Cardiology

## 2021-01-27 VITALS — BP 122/72 | HR 55 | Ht 62.0 in | Wt 131.6 lb

## 2021-01-27 DIAGNOSIS — R002 Palpitations: Secondary | ICD-10-CM | POA: Diagnosis not present

## 2021-01-27 DIAGNOSIS — Z7189 Other specified counseling: Secondary | ICD-10-CM

## 2021-01-27 DIAGNOSIS — I471 Supraventricular tachycardia: Secondary | ICD-10-CM

## 2021-01-27 DIAGNOSIS — Z8249 Family history of ischemic heart disease and other diseases of the circulatory system: Secondary | ICD-10-CM | POA: Diagnosis not present

## 2021-01-27 NOTE — Progress Notes (Signed)
Cardiology Office Note:    Date:  01/27/2021   ID:  [name], DOB 03/09/1955, MRN [id number]  PCP:  Jarrett Soho, PA-C  Cardiologist:  Jodelle Red, MD  Referring MD: Jarrett Soho, PA-C   CC: follow up  History of Present Illness:    Amy Pitts is a 66 y.o. female with a hx of seasonal allergies, depression who is seen in follow up today. She was initially seen 03/27/19 a new consult at the request of Jarrett Soho, PA-C for the evaluation and management of palpitations.  Today: Doing well overall. Exercises daily, without limitations. Doesn't feel the "clunks" in her chest, but feels her heart constantly. Also feels both irregular on occasion and also fast. Thinks this occurs multiple times every week, but the episodes are brief (seconds), self-resolving, and nonlimiting. No syncope. Feels like there has been a "cardiac shift" since her initial visit.  Will never take wellbutrin again as she feels this was a likely contributor to her symptoms. We discussed diet and exercise and data related to premature beats.  Has used nitro patch for shoulder pain, but gets headaches with this.  Denies chest pain, shortness of breath at rest or with normal exertion. No PND, orthopnea, LE edema or unexpected weight gain. No syncope.  Past Medical History:  Diagnosis Date   Palpitations with regular cardiac rhythm     Past Surgical History:  Procedure Laterality Date   BUNIONECTOMY     c cection      Current Medications: Current Outpatient Medications on File Prior to Visit  Medication Sig   loratadine (CLARITIN) 10 MG tablet Take 10 mg by mouth daily.   nitroGLYCERIN (NITRODUR - DOSED IN MG/24 HR) 0.2 mg/hr patch Place 0.2 mg onto the skin daily.   Vitamin D, Ergocalciferol, (DRISDOL) 1.25 MG (50000 UT) CAPS capsule TAKE 1 CAPSULE (50,000 UNITS TOTAL) BY MOUTH EVERY 7 (SEVEN) DAYS. (Patient taking differently: Take 2,000 Units by mouth daily.)   No  current facility-administered medications on file prior to visit.     Allergies:   Patient has no known allergies.   Social History   Tobacco Use   Smoking status: Former    Years: 2.00    Types: Cigarettes   Smokeless tobacco: Never  Substance Use Topics   Alcohol use: Yes    Alcohol/week: 2.0 standard drinks    Types: 2 drink(s) per week   Drug use: No    Family History: family history includes Heart disease in her father; Hypertension in her mother. There is no history of Breast cancer, Colon cancer, Esophageal cancer, Stomach cancer, or Rectal cancer.  ROS:   Please see the history of present illness.  Additional pertinent ROS otherwise unremarkable. EKGs/Labs/Other Studies Reviewed:    The following studies were reviewed today: Monitor 05/28/19 7 days of data recorded on Zio monitor. Patient had a min HR of 45 bpm, max HR of 171 bpm, and avg HR of 66 bpm. Predominant underlying rhythm was Sinus Rhythm. No atrial fibrillation, high degree block, or pauses noted. Isolated atrial and ventricular ectopy was rare (<1%). There were 69 triggered events.  Most of these were sinus rhythm with an ectopic beat.    1 run of (questionable) Ventricular Tachycardia occurred lasting 6 beats with a max rate of 152 bpm (avg 137 bpm). This occurred on 4/36/21 at 4:25 PM. 4 Supraventricular Tachycardia runs occurred, the run with the fastest interval lasting 15 beats with a max rate of 171  bpm (avg 148 bpm); the run with the fastest interval was also the longest.  EKG:  EKG is personally reviewed.   01/28/19 sinus bradycardia at 53 bpm, borderline R wave progression 03/27/19 demonstrates sinus bradycardia with artifact  Recent Labs: No results found for requested labs within last 8760 hours.  Recent Lipid Panel No results found for: CHOL, TRIG, HDL, CHOLHDL, VLDL, LDLCALC, LDLDIRECT  Physical Exam:    VS:  BP 122/72    Pulse (!) 55    Ht 5\' 2"  (1.575 m)    Wt 131 lb 9.6 oz (59.7 kg)    BMI  24.07 kg/m     Wt Readings from Last 3 Encounters:  01/27/21 131 lb 9.6 oz (59.7 kg)  06/18/19 129 lb (58.5 kg)  06/12/19 128 lb (58.1 kg)    GEN: Well nourished, well developed in no acute distress HEENT: Normal, moist mucous membranes NECK: No JVD CARDIAC: regular rhythm, normal S1 and S2, no rubs or gallops. No murmur. VASCULAR: Radial and DP pulses 2+ bilaterally. No carotid bruits RESPIRATORY:  Clear to auscultation without rales, wheezing or rhonchi  ABDOMEN: Soft, non-tender, non-distended MUSCULOSKELETAL:  Ambulates independently SKIN: Warm and dry, no edema NEUROLOGIC:  Alert and oriented x 3. No focal neuro deficits noted. PSYCHIATRIC:  Normal affect    ASSESSMENT:    1. Paroxysmal SVT (supraventricular tachycardia) (HCC)   2. Palpitations   3. Family history of heart disease   4. Cardiac risk counseling   5. Counseling on health promotion and disease prevention     PLAN:    Palpitations: -ECG sinus bradycardia -Zio without significant arrhythmia. Very brief paroxysmal SVT, commonly seen. Palpitation sensation largely lines up with sinus rhythm -echo unremarkable -she does have occasional sensations of fast and/or irregular beats, but these are brief, nonlimiting, and self resolving -reviewed red flag warning signs that need immediate medical attention  Family history of heart disease, in both mother and father: -per KPN, lipids 04/03/19 show Tchol 201, HDL 72, LDL 112, TG 93  Cardiac risk counseling and prevention recommendations: -recommend heart healthy/Mediterranean diet, with whole grains, fruits, vegetable, fish, lean meats, nuts, and olive oil. Limit salt. -recommend moderate walking, 3-5 times/week for 30-50 minutes each session. Aim for at least 150 minutes.week. Goal should be pace of 3 miles/hours, or walking 1.5 miles in 30 minutes -recommend avoidance of tobacco products. Avoid excess alcohol.  Plan for follow up: 2 years or sooner as  needed  04/05/19, MD, PhD [place]   [facility]    Medication Adjustments/Labs and Tests Ordered: Current medicines are reviewed at length with the patient today.  Concerns regarding medicines are outlined above.  Orders Placed This Encounter  Procedures   EKG 12-Lead   No orders of the defined types were placed in this encounter.   Patient Instructions  Medication Instructions:  Your Physician recommend you continue on your current medication as directed.    *If you need a refill on your cardiac medications before your next appointment, please call your pharmacy*   Lab Work: None ordered today   Testing/Procedures: None ordered today   Follow-Up: At [facility], you and your health needs are our priority.  As part of our continuing mission to provide you with exceptional heart care, we have created designated Provider Care Teams.  These Care Teams include your primary Cardiologist (physician) and Advanced Practice Providers (APPs -  Physician Assistants and Nurse Practitioners) who all work together to provide you with the care  you need, when you need it.  We recommend signing up for the patient portal called "MyChart".  Sign up information is provided on this After Visit Summary.  MyChart is used to connect with patients for Virtual Visits (Telemedicine).  Patients are able to view lab/test results, encounter notes, upcoming appointments, etc.  Non-urgent messages can be sent to your provider as well.   To learn more about what you can do with MyChart, go to [email or web address].    Your next appointment:   2 year(s)  The format for your next appointment:   In Person  Provider:   Jodelle RedBridgette Neveah Bang, MD      Signed, Jodelle RedBridgette Loveta Dellis, MD PhD 01/27/2021  [facility]

## 2021-01-27 NOTE — Patient Instructions (Signed)

## 2021-12-14 DIAGNOSIS — R202 Paresthesia of skin: Secondary | ICD-10-CM | POA: Diagnosis not present

## 2021-12-14 DIAGNOSIS — B351 Tinea unguium: Secondary | ICD-10-CM | POA: Diagnosis not present

## 2021-12-14 DIAGNOSIS — L814 Other melanin hyperpigmentation: Secondary | ICD-10-CM | POA: Diagnosis not present

## 2021-12-14 DIAGNOSIS — L821 Other seborrheic keratosis: Secondary | ICD-10-CM | POA: Diagnosis not present

## 2021-12-14 DIAGNOSIS — D229 Melanocytic nevi, unspecified: Secondary | ICD-10-CM | POA: Diagnosis not present

## 2021-12-14 DIAGNOSIS — L578 Other skin changes due to chronic exposure to nonionizing radiation: Secondary | ICD-10-CM | POA: Diagnosis not present

## 2022-04-10 DIAGNOSIS — H2513 Age-related nuclear cataract, bilateral: Secondary | ICD-10-CM | POA: Diagnosis not present

## 2022-04-10 DIAGNOSIS — H0102A Squamous blepharitis right eye, upper and lower eyelids: Secondary | ICD-10-CM | POA: Diagnosis not present

## 2022-04-10 DIAGNOSIS — H35361 Drusen (degenerative) of macula, right eye: Secondary | ICD-10-CM | POA: Diagnosis not present

## 2022-04-10 DIAGNOSIS — H25013 Cortical age-related cataract, bilateral: Secondary | ICD-10-CM | POA: Diagnosis not present

## 2022-06-16 ENCOUNTER — Encounter (HOSPITAL_BASED_OUTPATIENT_CLINIC_OR_DEPARTMENT_OTHER): Payer: Self-pay | Admitting: Cardiology

## 2022-06-16 ENCOUNTER — Ambulatory Visit (HOSPITAL_BASED_OUTPATIENT_CLINIC_OR_DEPARTMENT_OTHER): Payer: Medicare PPO | Admitting: Cardiology

## 2022-06-16 VITALS — BP 114/62 | HR 60 | Ht 62.0 in | Wt 130.1 lb

## 2022-06-16 DIAGNOSIS — Z7189 Other specified counseling: Secondary | ICD-10-CM

## 2022-06-16 DIAGNOSIS — I471 Supraventricular tachycardia, unspecified: Secondary | ICD-10-CM | POA: Diagnosis not present

## 2022-06-16 DIAGNOSIS — Z8249 Family history of ischemic heart disease and other diseases of the circulatory system: Secondary | ICD-10-CM | POA: Diagnosis not present

## 2022-06-16 NOTE — Progress Notes (Signed)
Cardiology Office Note:    Date:  06/16/2022   ID:  Amy Pitts, DOB 10/18/1955, MRN [id number]  PCP:  Jarrett Soho, PA-C  Cardiologist:  Jodelle Red, MD  Referring MD: Jarrett Soho, PA-C   CC: follow up  History of Present Illness:    Amy Pitts is a 67 y.o. female with a hx of seasonal allergies, depression who is seen in follow up today. She was initially seen 03/27/19 a new consult at the request of Jarrett Soho, PA-C for the evaluation and management of palpitations.  Today: Doing well overall. Feels 95% better than when I first met her. Does still feel that her heart rate goes up in situations with a surge of adrenaline. Rare PVCs. No more fog/slamming/squirming sensation in the last year.   Wants to [place], 2 day trip. Happening in 1 month. 8000 feet total elevation. Reviewed her prior studies, discussed what to watch for at length. Has been working up to 30-45 minutes on cardiovascular equipment to keep her HR up to 120 min.  Denies chest pain, shortness of breath at rest or with normal exertion. No PND, orthopnea, LE edema or unexpected weight gain. No syncope or palpitations.   ROS for BPPV intermittently, none in several weeks.  Past Medical History:  Diagnosis Date   Palpitations with regular cardiac rhythm     Past Surgical History:  Procedure Laterality Date   BUNIONECTOMY     c cection      Current Medications: Current Outpatient Medications on File Prior to Visit  Medication Sig   loratadine (CLARITIN) 10 MG tablet Take 10 mg by mouth daily.   nitroGLYCERIN (NITRODUR - DOSED IN MG/24 HR) 0.2 mg/hr patch Place 0.2 mg onto the skin daily.   Vitamin D, Ergocalciferol, (DRISDOL) 1.25 MG (50000 UT) CAPS capsule TAKE 1 CAPSULE (50,000 UNITS TOTAL) BY MOUTH EVERY 7 (SEVEN) DAYS. (Patient not taking: Reported on 06/16/2022)   No current facility-administered medications on file prior to visit.     Allergies:    Patient has no known allergies.   Social History   Tobacco Use   Smoking status: Former    Years: 2    Types: Cigarettes   Smokeless tobacco: Never  Substance Use Topics   Alcohol use: Yes    Alcohol/week: 2.0 standard drinks of alcohol    Types: 2 drink(s) per week   Drug use: No    Family History: family history includes Heart disease in her father; Hypertension in her mother. There is no history of Breast cancer, Colon cancer, Esophageal cancer, Stomach cancer, or Rectal cancer.  ROS:   Please see the history of present illness.  Additional pertinent ROS otherwise unremarkable. EKGs/Labs/Other Studies Reviewed:    The following studies were reviewed today: Monitor 05/28/19 7 days of data recorded on Zio monitor. Patient had a min HR of 45 bpm, max HR of 171 bpm, and avg HR of 66 bpm. Predominant underlying rhythm was Sinus Rhythm. No atrial fibrillation, high degree block, or pauses noted. Isolated atrial and ventricular ectopy was rare (<1%). There were 69 triggered events.  Most of these were sinus rhythm with an ectopic beat.    1 run of (questionable) Ventricular Tachycardia occurred lasting 6 beats with a max rate of 152 bpm (avg 137 bpm). This occurred on 4/36/21 at 4:25 PM. 4 Supraventricular Tachycardia runs occurred, the run with the fastest interval lasting 15 beats with a max rate of 171 bpm (avg 148  bpm); the run with the fastest interval was also the longest.  EKG:  EKG is personally reviewed.   06/16/22: NSR at 60 bpm 01/28/19 sinus bradycardia at 53 bpm, borderline R wave progression 03/27/19 demonstrates sinus bradycardia with artifact  Recent Labs: No results found for requested labs within last 365 days.  Recent Lipid Panel No results found for: "CHOL", "TRIG", "HDL", "CHOLHDL", "VLDL", "LDLCALC", "LDLDIRECT"  Physical Exam:    VS:  BP 114/62   Pulse 60   Ht 5\' 2"  (1.575 m)   Wt 130 lb 1.6 oz (59 kg)   SpO2 97%   BMI 23.80 kg/m     Wt Readings from  Last 3 Encounters:  06/16/22 130 lb 1.6 oz (59 kg)  01/27/21 131 lb 9.6 oz (59.7 kg)  06/18/19 129 lb (58.5 kg)    GEN: Well nourished, well developed in no acute distress HEENT: Normal, moist mucous membranes NECK: No JVD CARDIAC: regular rhythm, normal S1 and S2, no rubs or gallops. No murmur. VASCULAR: Radial and DP pulses 2+ bilaterally. No carotid bruits RESPIRATORY:  Clear to auscultation without rales, wheezing or rhonchi  ABDOMEN: Soft, non-tender, non-distended MUSCULOSKELETAL:  Ambulates independently SKIN: Warm and dry, no edema NEUROLOGIC:  Alert and oriented x 3. No focal neuro deficits noted. PSYCHIATRIC:  Normal affect    ASSESSMENT:    1. Paroxysmal SVT (supraventricular tachycardia)   2. Family history of heart disease   3. Cardiac risk counseling   4. Counseling on health promotion and disease prevention    PLAN:    Palpitations: -ECG sinus bradycardia -Zio without significant arrhythmia. Very brief paroxysmal SVT, commonly seen. Palpitation sensation largely lines up with sinus rhythm -echo unremarkable -reviewed red flag warning signs that need immediate medical attention  Family history of heart disease, in both mother and father: -per [facility], lipids 03/2020 show Tchol 183, HDL 70, LDL 102, TG 59  Cardiac risk counseling and prevention recommendations: -recommend heart healthy/Mediterranean diet, with whole grains, fruits, vegetable, fish, lean meats, nuts, and olive oil. Limit salt. -recommend moderate walking, 3-5 times/week for 30-50 minutes each session. Aim for at least 150 minutes.week. Goal should be pace of 3 miles/hours, or walking 1.5 miles in 30 minutes -recommend avoidance of tobacco products. Avoid excess alcohol.  Plan for follow up: 2 years or sooner as needed  Jodelle Red, MD, PhD [place]  [facility]    Medication Adjustments/Labs and Tests Ordered: Current medicines are reviewed at length with the patient today.   Concerns regarding medicines are outlined above.  Orders Placed This Encounter  Procedures   EKG 12-Lead   No orders of the defined types were placed in this encounter.   Patient Instructions  Medication Instructions:  Your physician recommends that you continue on your current medications as directed. Please refer to the Current Medication list given to you today.  *If you need a refill on your cardiac medications before your next appointment, please call your pharmacy*  Follow-Up: At [facility], you and your health needs are our priority.  As part of our continuing mission to provide you with exceptional heart care, we have created designated Provider Care Teams.  These Care Teams include your primary Cardiologist (physician) and Advanced Practice Providers (APPs -  Physician Assistants and Nurse Practitioners) who all work together to provide you with the care you need, when you need it.  We recommend signing up for the patient portal called "MyChart".  Sign up information is provided on this  After Visit Summary.  MyChart is used to connect with patients for Virtual Visits (Telemedicine).  Patients are able to view lab/test results, encounter notes, upcoming appointments, etc.  Non-urgent messages can be sent to your provider as well.   To learn more about what you can do with MyChart, go to [email or web address].    Your next appointment:   2 year(s)  Provider:   Jodelle Red, MD      Signed, Jodelle Red, MD PhD 06/16/2022  [facility]

## 2022-06-16 NOTE — Patient Instructions (Signed)
Medication Instructions:  Your physician recommends that you continue on your current medications as directed. Please refer to the Current Medication list given to you today.  *If you need a refill on your cardiac medications before your next appointment, please call your pharmacy*  Follow-Up: At [facility], you and your health needs are our priority.  As part of our continuing mission to provide you with exceptional heart care, we have created designated Provider Care Teams.  These Care Teams include your primary Cardiologist (physician) and Advanced Practice Providers (APPs -  Physician Assistants and Nurse Practitioners) who all work together to provide you with the care you need, when you need it.  We recommend signing up for the patient portal called "MyChart".  Sign up information is provided on this After Visit Summary.  MyChart is used to connect with patients for Virtual Visits (Telemedicine).  Patients are able to view lab/test results, encounter notes, upcoming appointments, etc.  Non-urgent messages can be sent to your provider as well.   To learn more about what you can do with MyChart, go to [email or web address].    Your next appointment:   2 year(s)  Provider:   Jodelle Red, MD

## 2022-11-29 IMAGING — MG MM DIGITAL SCREENING BILAT W/ TOMO AND CAD
6 of 10 series · 6 of 30 positions shown · non-contrast
Comparison: Previous exam(s).

CLINICAL DATA: Screening.

EXAM:
DIGITAL SCREENING BILATERAL MAMMOGRAM WITH TOMOSYNTHESIS AND CAD
TECHNIQUE: Bilateral screening digital craniocaudal and mediolateral oblique
mammograms were obtained. Bilateral screening digital breast
tomosynthesis was performed. The images were evaluated with
computer-aided detection.

[L MLO synth-2D]
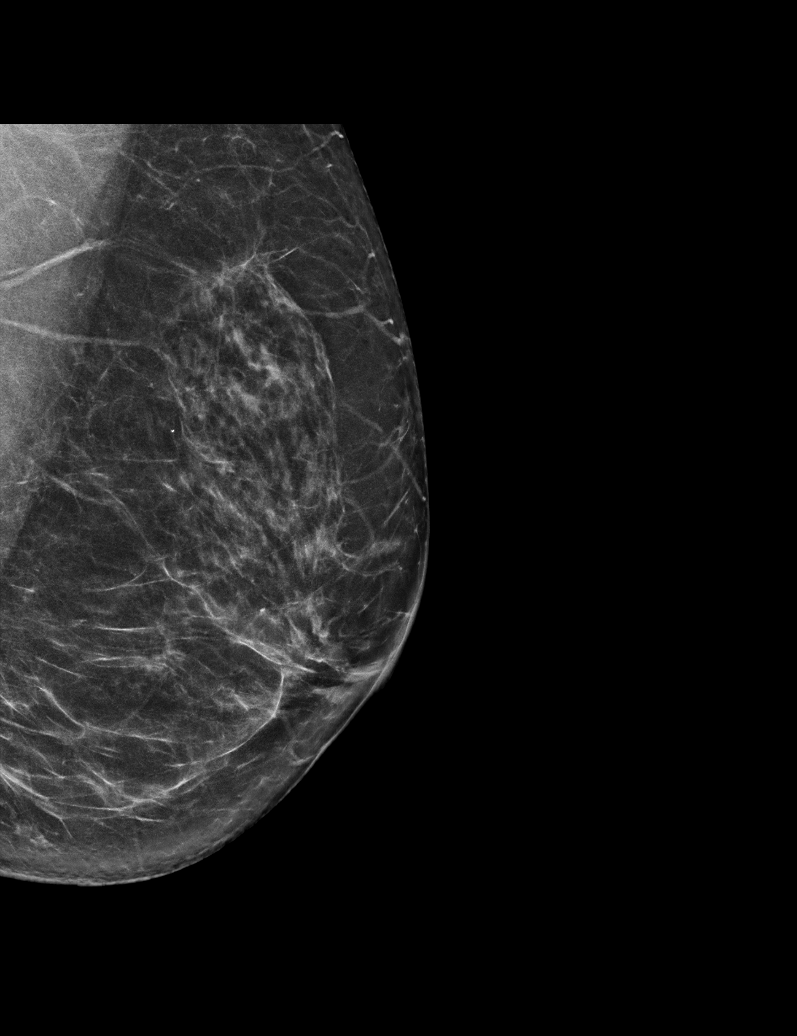

[R MLO synth-2D]
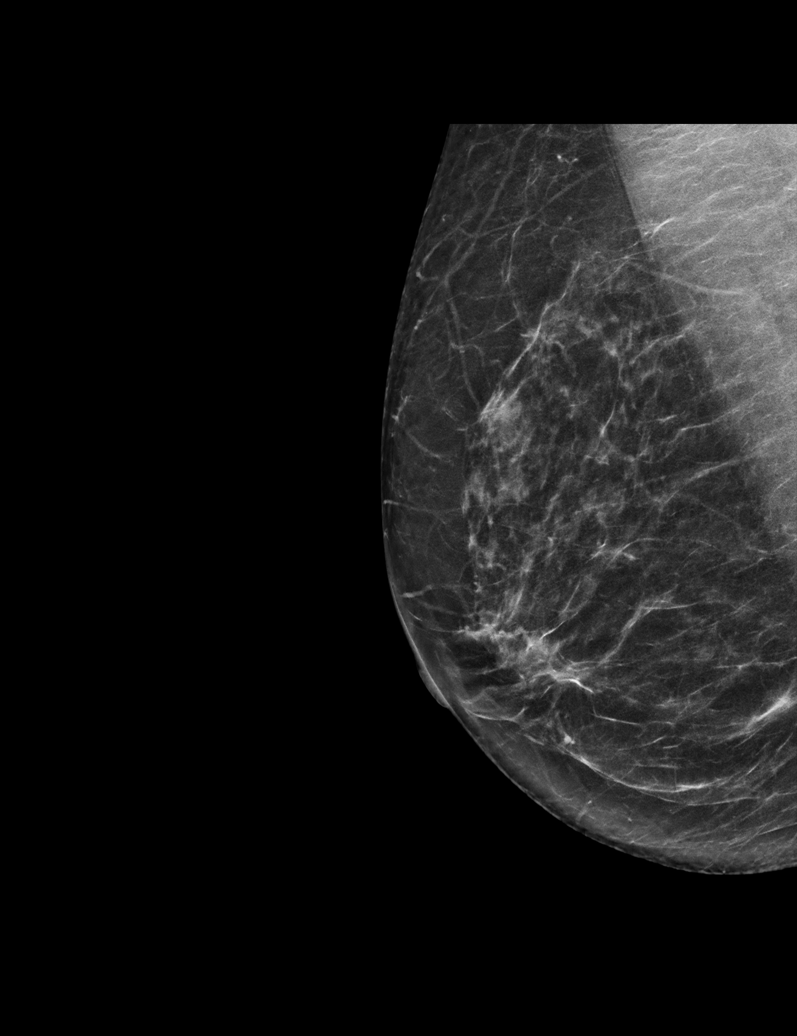

[L CC synth-2D]
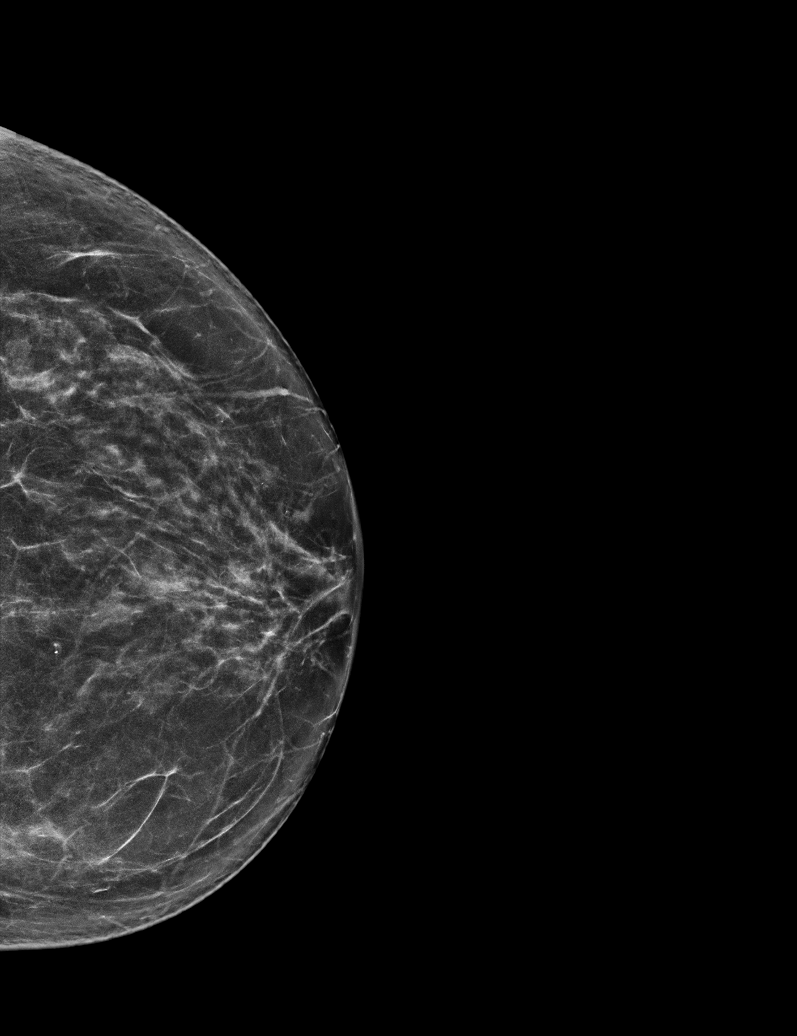

[R CC synth-2D (1 of 2)]
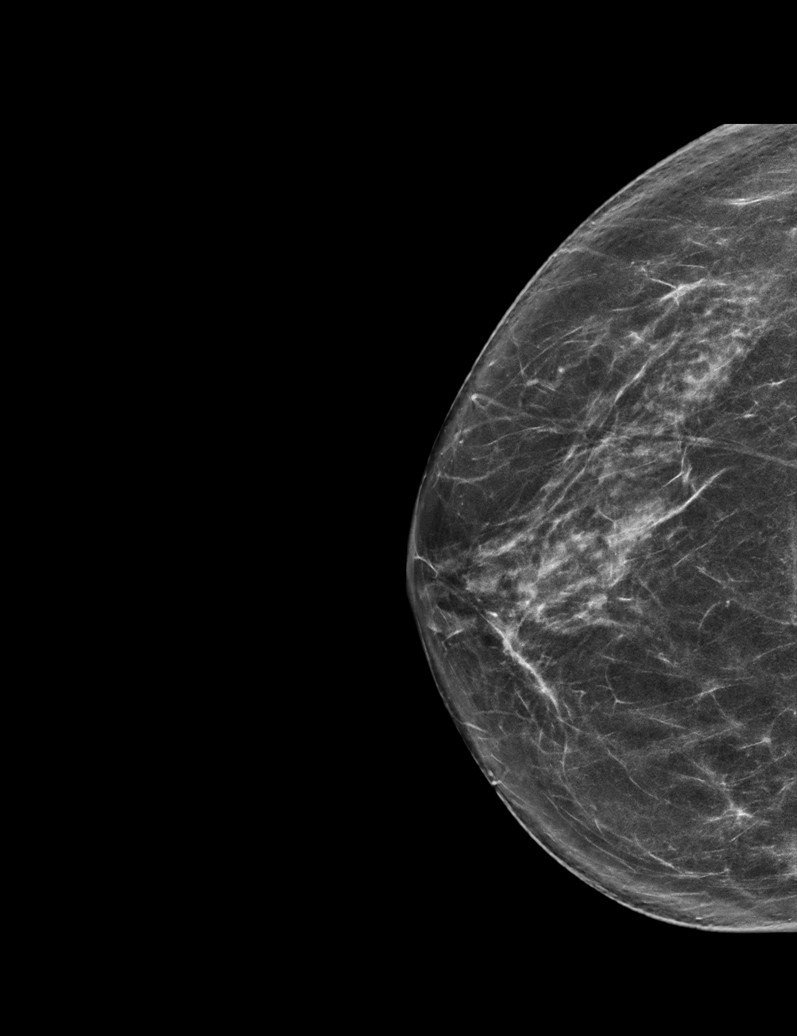

[R CC synth-2D (2 of 2)]
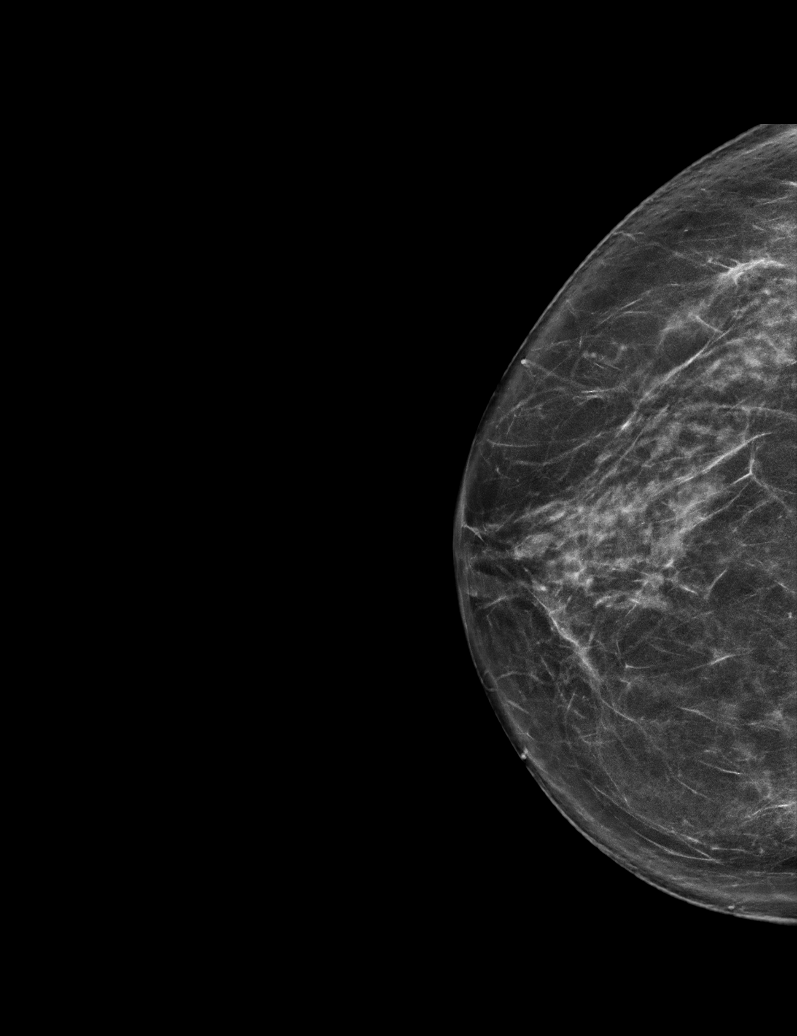

[R CC tomo · tomo slice 34/67.0]
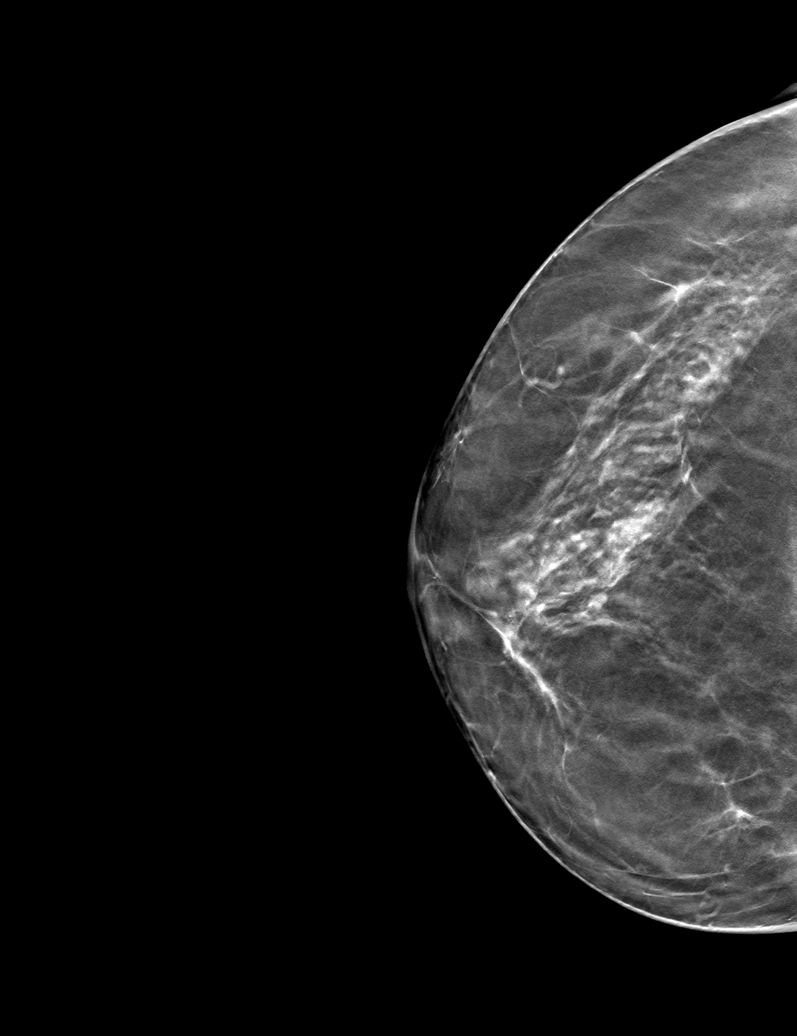

[6 of 30 positions shown; findings below may reference images not displayed]

ACR Breast Density Category b: There are scattered areas of
fibroglandular density.
FINDINGS: There are no findings suspicious for malignancy.
IMPRESSION: No mammographic evidence of malignancy. A result letter of this
screening mammogram will be mailed directly to the patient.

RECOMMENDATION:
Screening mammogram in one year. (Code:51-O-LD2)

BI-RADS CATEGORY  1: Negative.

## 2023-04-17 DIAGNOSIS — H35361 Drusen (degenerative) of macula, right eye: Secondary | ICD-10-CM | POA: Diagnosis not present

## 2023-04-17 DIAGNOSIS — H1045 Other chronic allergic conjunctivitis: Secondary | ICD-10-CM | POA: Diagnosis not present

## 2023-04-17 DIAGNOSIS — H25813 Combined forms of age-related cataract, bilateral: Secondary | ICD-10-CM | POA: Diagnosis not present

## 2023-04-17 DIAGNOSIS — H04123 Dry eye syndrome of bilateral lacrimal glands: Secondary | ICD-10-CM | POA: Diagnosis not present

## 2023-05-22 DIAGNOSIS — L821 Other seborrheic keratosis: Secondary | ICD-10-CM | POA: Diagnosis not present

## 2023-05-22 DIAGNOSIS — L578 Other skin changes due to chronic exposure to nonionizing radiation: Secondary | ICD-10-CM | POA: Diagnosis not present

## 2023-05-22 DIAGNOSIS — L820 Inflamed seborrheic keratosis: Secondary | ICD-10-CM | POA: Diagnosis not present

## 2023-05-22 DIAGNOSIS — W57XXXA Bitten or stung by nonvenomous insect and other nonvenomous arthropods, initial encounter: Secondary | ICD-10-CM | POA: Diagnosis not present

## 2023-05-22 DIAGNOSIS — B351 Tinea unguium: Secondary | ICD-10-CM | POA: Diagnosis not present

## 2023-05-22 DIAGNOSIS — S1096XA Insect bite of unspecified part of neck, initial encounter: Secondary | ICD-10-CM | POA: Diagnosis not present

## 2023-05-22 DIAGNOSIS — D229 Melanocytic nevi, unspecified: Secondary | ICD-10-CM | POA: Diagnosis not present

## 2023-05-22 DIAGNOSIS — L814 Other melanin hyperpigmentation: Secondary | ICD-10-CM | POA: Diagnosis not present

## 2023-12-17 DIAGNOSIS — Z6823 Body mass index (BMI) 23.0-23.9, adult: Secondary | ICD-10-CM | POA: Diagnosis not present

## 2023-12-17 DIAGNOSIS — M259 Joint disorder, unspecified: Secondary | ICD-10-CM | POA: Diagnosis not present

## 2023-12-17 DIAGNOSIS — E559 Vitamin D deficiency, unspecified: Secondary | ICD-10-CM | POA: Diagnosis not present

## 2024-01-16 NOTE — Progress Notes (Signed)
 " Amy Pitts [address] [place] [address] Phone: [phone] Subjective:   Amy Pitts am a scribe for Dr. Claudene.   I'm seeing this patient by the request  of:  Amy Pfeiffer, PA-C  CC: Bilateral knee pain  YEP:Dlagzrupcz  Amy Pitts is a 69 y.o. female coming in with complaint of bilateral knee pain. Patient states that she was doing some vigorous stretching everyday to open up her hips. One morning she just woke up and the knee started to hurt. Pain to the medial portion of the knee. Right knee was a little swollen wasn't red or hot. PCP ordered blood work. Patient brought the results with her. Been using Ibuprofen, increased tumeric, and reduced carbs. Stopped exercises except walking and gentle stretching that didn't involve the knee. Had a terrible pulling feeling on the right side. It slowly started to improve. Wants to know what she can do safely. She is about 95% better already. Restarted the OTC Vitamin D .   Patient was last seen in our office in 2019    Past Medical History:  Diagnosis Date   Palpitations with regular cardiac rhythm    Past Surgical History:  Procedure Laterality Date   BUNIONECTOMY     c cection     Social History   Socioeconomic History   Marital status: Married    Spouse name: Not on file   Number of children: Not on file   Years of education: Not on file   Highest education level: Not on file  Occupational History   Not on file  Tobacco Use   Smoking status: Former    Types: Cigarettes   Smokeless tobacco: Never  Substance and Sexual Activity   Alcohol use: Yes    Alcohol/week: 2.0 standard drinks of alcohol    Types: 2 drink(s) per week   Drug use: No   Sexual activity: Not on file  Other Topics Concern   Not on file  Social History Narrative   Not on file   Social Drivers of Health   Tobacco Use: Medium Risk (06/16/2022)   Patient History    Smoking Tobacco Use: Former     Smokeless Tobacco Use: Never    Passive Exposure: Not on Actuary Strain: Not on file  Food Insecurity: Not on file  Transportation Needs: Not on file  Physical Activity: Not on file  Stress: Not on file  Social Connections: Not on file  Depression (PHQ2-9): Not on file  Alcohol Screen: Not on file  Housing: Not on file  Utilities: Not on file  Health Literacy: Not on file   Allergies[1] Family History  Problem Relation Age of Onset   Hypertension Mother    Heart disease Father    Breast cancer Neg Hx    Colon cancer Neg Hx    Esophageal cancer Neg Hx    Stomach cancer Neg Hx    Rectal cancer Neg Hx     Current Outpatient Medications (Cardiovascular):    nitroGLYCERIN  (NITRODUR - DOSED IN MG/24 HR) 0.2 mg/hr patch, Place 0.2 mg onto the skin daily.  Current Outpatient Medications (Respiratory):    loratadine (CLARITIN) 10 MG tablet, Take 10 mg by mouth daily.  Current Outpatient Medications (Other):    Vitamin D , Ergocalciferol , (DRISDOL ) 1.25 MG (50000 UT) CAPS capsule, TAKE 1 CAPSULE (50,000 UNITS TOTAL) BY MOUTH EVERY 7 (SEVEN) DAYS.   Reviewed prior external information including notes and imaging from  primary care provider As well as notes that were available from care everywhere and other healthcare systems.  Past medical history, social, surgical and family history all reviewed in electronic medical record.  No pertanent information unless stated regarding to the chief complaint.   Review of Systems:  No headache, visual changes, nausea, vomiting, diarrhea, constipation, dizziness, abdominal pain, skin rash, fevers, chills, night sweats, weight loss, swollen lymph nodes, body aches, joint swelling, chest pain, shortness of breath, mood changes. POSITIVE muscle aches  Objective  Blood pressure (!) 142/70, pulse 61, height 5' 2 (1.575 m), weight 132 lb (59.9 kg), SpO2 97%.   General: No apparent distress alert and oriented x3 mood and affect  normal, dressed appropriately.  HEENT: Pupils equal, extraocular movements intact  Respiratory: Patient's speak in full sentences and does not appear short of breath  Cardiovascular: No lower extremity edema, non tender, no erythema  Right knee does have a trace amount of fluid compared to the contralateral side.  More tenderness over the medial joint line than anywhere else.  Positive McMurray's on the right.  Good range of motion though noted of both knees.  Ambulates without any significant difficulty.  Limited muscular skeletal ultrasound was performed and interpreted by CLAUDENE HUSSAR, M   Limited ultrasound shows the patient does have moderate narrowing of the medial joint space bilaterally right greater than left.  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  Hypoechoic changes noted as well. Impression: Knee arthritis with medial meniscal tear   Impression and Recommendations:     The above documentation has been reviewed and is accurate and complete Delicia Berens M Montavious Wierzba, DO       [1] No Known Allergies  "

## 2024-01-17 ENCOUNTER — Ambulatory Visit: Admitting: Family Medicine

## 2024-01-17 ENCOUNTER — Encounter: Payer: Self-pay | Admitting: Family Medicine

## 2024-01-17 ENCOUNTER — Other Ambulatory Visit: Payer: Self-pay

## 2024-01-17 ENCOUNTER — Ambulatory Visit

## 2024-01-17 VITALS — BP 142/70 | HR 61 | Ht 62.0 in | Wt 132.0 lb

## 2024-01-17 DIAGNOSIS — M25561 Pain in right knee: Secondary | ICD-10-CM

## 2024-01-17 DIAGNOSIS — M25569 Pain in unspecified knee: Secondary | ICD-10-CM

## 2024-01-17 DIAGNOSIS — S83241A Other tear of medial meniscus, current injury, right knee, initial encounter: Secondary | ICD-10-CM | POA: Insufficient documentation

## 2024-01-17 DIAGNOSIS — M25562 Pain in left knee: Secondary | ICD-10-CM | POA: Diagnosis not present

## 2024-01-17 NOTE — Patient Instructions (Addendum)
 Good to see you. Exercises. Tart cherry 3600 mg. Ice at the end of a long day. Voltaren or Arnica topically on areas of pain.  Avoid twisting. See me again in 2 to 3 months.

## 2024-01-17 NOTE — Assessment & Plan Note (Signed)
 Appears to be acute on chronic, discussed topical, home exercises, icing regimen.  Discussed with patient about which activities to do and which ones to avoid.  Increase activity slowly.  Follow-up with me again in 612 weeks worsening pain consider injection and formal physical therapy

## 2024-01-25 ENCOUNTER — Ambulatory Visit: Payer: Self-pay | Admitting: Family Medicine

## 2024-02-08 ENCOUNTER — Other Ambulatory Visit (HOSPITAL_BASED_OUTPATIENT_CLINIC_OR_DEPARTMENT_OTHER): Payer: Self-pay | Admitting: Family Medicine

## 2024-02-08 DIAGNOSIS — E2839 Other primary ovarian failure: Secondary | ICD-10-CM

## 2024-03-17 ENCOUNTER — Ambulatory Visit: Admitting: Family Medicine
